# Patient Record
Sex: Female | Born: 1935 | Race: White | Hispanic: No | Marital: Married | Smoking: Never smoker
Health system: Southern US, Community
[De-identification: ages and names within clinical notes are randomized; demographics above are authoritative.]

## PROBLEM LIST (undated history)

## (undated) DIAGNOSIS — C50919 Malignant neoplasm of unspecified site of unspecified female breast: Secondary | ICD-10-CM

## (undated) DIAGNOSIS — E039 Hypothyroidism, unspecified: Secondary | ICD-10-CM

## (undated) DIAGNOSIS — H269 Unspecified cataract: Secondary | ICD-10-CM

## (undated) DIAGNOSIS — M2040 Other hammer toe(s) (acquired), unspecified foot: Secondary | ICD-10-CM

## (undated) DIAGNOSIS — N189 Chronic kidney disease, unspecified: Secondary | ICD-10-CM

## (undated) DIAGNOSIS — I10 Essential (primary) hypertension: Secondary | ICD-10-CM

## (undated) DIAGNOSIS — E7800 Pure hypercholesterolemia, unspecified: Secondary | ICD-10-CM

## (undated) HISTORY — DX: Pure hypercholesterolemia, unspecified: E78.00

## (undated) HISTORY — DX: Unspecified cataract: H26.9

## (undated) HISTORY — PX: APPENDECTOMY: SHX54

## (undated) HISTORY — PX: OTHER SURGICAL HISTORY: SHX169

## (undated) HISTORY — DX: Hypothyroidism, unspecified: E03.9

## (undated) HISTORY — DX: Chronic kidney disease, unspecified: N18.9

## (undated) HISTORY — DX: Essential (primary) hypertension: I10

## (undated) HISTORY — DX: Other hammer toe(s) (acquired), unspecified foot: M20.40

## (undated) HISTORY — PX: CATARACT EXTRACTION: SUR2

## (undated) HISTORY — DX: Malignant neoplasm of unspecified site of unspecified female breast: C50.919

## (undated) HISTORY — PX: TOTAL ABDOMINAL HYSTERECTOMY W/ BILATERAL SALPINGOOPHORECTOMY: SHX83

## (undated) HISTORY — PX: CHOLECYSTECTOMY: SHX55

---

## 2004-12-26 HISTORY — PX: MASTECTOMY: SHX3

## 2005-01-06 ENCOUNTER — Ambulatory Visit: Payer: Self-pay | Admitting: Internal Medicine

## 2005-01-11 ENCOUNTER — Ambulatory Visit: Payer: Self-pay | Admitting: Internal Medicine

## 2005-02-01 ENCOUNTER — Ambulatory Visit: Payer: Self-pay | Admitting: Surgery

## 2005-02-08 ENCOUNTER — Other Ambulatory Visit: Payer: Self-pay

## 2005-02-09 ENCOUNTER — Ambulatory Visit: Payer: Self-pay | Admitting: Surgery

## 2005-02-24 ENCOUNTER — Ambulatory Visit: Payer: Self-pay | Admitting: Radiation Oncology

## 2005-03-15 ENCOUNTER — Ambulatory Visit: Payer: Self-pay | Admitting: Surgery

## 2005-03-26 ENCOUNTER — Ambulatory Visit: Payer: Self-pay | Admitting: Radiation Oncology

## 2005-04-27 ENCOUNTER — Ambulatory Visit: Payer: Self-pay | Admitting: Internal Medicine

## 2005-05-26 ENCOUNTER — Ambulatory Visit: Payer: Self-pay | Admitting: Internal Medicine

## 2005-06-21 ENCOUNTER — Ambulatory Visit: Payer: Self-pay | Admitting: Ophthalmology

## 2005-07-23 IMAGING — NM NM SENTINAL NODE INJECTION (BREAST) - NO REPORT
2 series · 14 of 14 positions shown · non-contrast
Comparison: none

REASON FOR EXAM: Rt breast mass. Surgery [DATE]. Breast needle localization
at 10 am
COMMENTS:

[Series 1: sent node · 4.80mm/px · 6 of 56 frames shown (1 of 2)]
[frame 5/56  full-range]
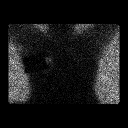
[frame 14/56  full-range]
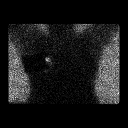
[frame 24/56  full-range]
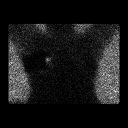
[frame 33/56  full-range]
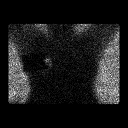
[frame 42/56  full-range]
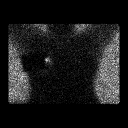
[frame 52/56  full-range]
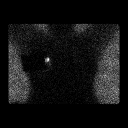

[Series 1: sent node · 2.40mm/px · 4 acquisitions, 8 frames shown (2 of 2)]
[im 1/4]
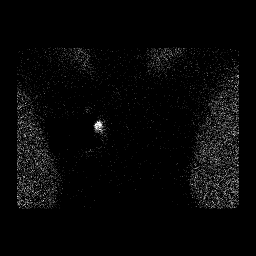
[im 1/4]
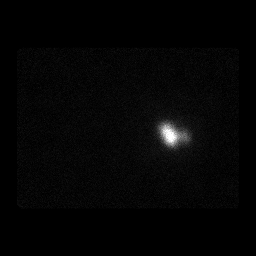
[im 2/4]
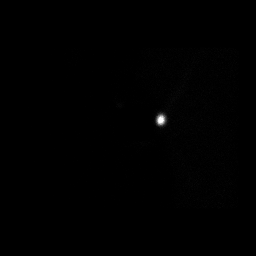
[im 2/4]
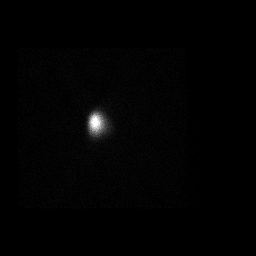
[im 3/4]
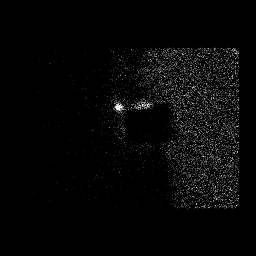
[im 3/4]
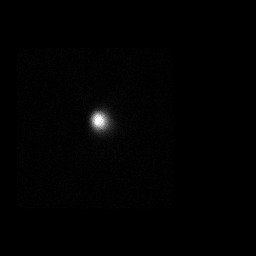
[im 4/4]
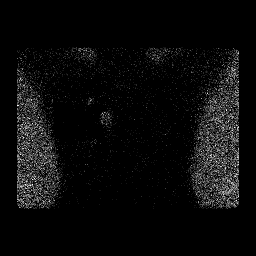
[im 4/4]
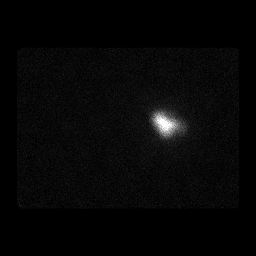

[14 of 14 positions shown; findings below may reference images not displayed]

PROCEDURE:     NM  - NM SENTINEL NODE  BREAST  - February 09, 2005 [DATE]

RESULT:     The patient was informed of the risks and benefits of the
procedure and proper informed consent was obtained.  The patient was brought
to the nuclear medicine suite and placed in a supine position.  The
periareolar region of the RIGHT breast was then prepped and draped in the
usual sterile fashion.  Utilizing approximately 4 cc of 1% Lidocaine without
epinephrine, the lateral periareolar region was anesthetized.  The lateral
periareolar region was then injected with 1.07 millicuries of
technetium-labeled sulfur colloid unfiltered.  Standard images were obtained
of the chest.  A RIGHT axillary sentinel node is identified.
IMPRESSION: RIGHT breast sentinel node evaluation as described above.  The patient
tolerated the procedure without complications.

## 2005-07-27 ENCOUNTER — Ambulatory Visit: Payer: Self-pay | Admitting: Internal Medicine

## 2005-08-26 ENCOUNTER — Ambulatory Visit: Payer: Self-pay | Admitting: Internal Medicine

## 2005-11-24 ENCOUNTER — Ambulatory Visit: Payer: Self-pay | Admitting: Internal Medicine

## 2006-01-13 ENCOUNTER — Ambulatory Visit: Payer: Self-pay | Admitting: Internal Medicine

## 2006-03-20 ENCOUNTER — Ambulatory Visit: Payer: Self-pay | Admitting: Radiation Oncology

## 2006-05-23 ENCOUNTER — Ambulatory Visit: Payer: Self-pay | Admitting: Internal Medicine

## 2006-05-26 ENCOUNTER — Ambulatory Visit: Payer: Self-pay | Admitting: Internal Medicine

## 2006-07-18 ENCOUNTER — Ambulatory Visit: Payer: Self-pay | Admitting: Internal Medicine

## 2006-08-14 ENCOUNTER — Ambulatory Visit: Payer: Self-pay | Admitting: Unknown Physician Specialty

## 2006-09-27 ENCOUNTER — Ambulatory Visit: Payer: Self-pay | Admitting: Internal Medicine

## 2006-10-26 ENCOUNTER — Ambulatory Visit: Payer: Self-pay | Admitting: Internal Medicine

## 2007-01-15 ENCOUNTER — Ambulatory Visit: Payer: Self-pay | Admitting: Internal Medicine

## 2007-02-24 ENCOUNTER — Ambulatory Visit: Payer: Self-pay | Admitting: Internal Medicine

## 2007-02-28 ENCOUNTER — Ambulatory Visit: Payer: Self-pay | Admitting: Internal Medicine

## 2007-03-27 ENCOUNTER — Ambulatory Visit: Payer: Self-pay | Admitting: Internal Medicine

## 2008-01-25 ENCOUNTER — Ambulatory Visit: Payer: Self-pay | Admitting: Internal Medicine

## 2009-01-26 ENCOUNTER — Ambulatory Visit: Payer: Self-pay | Admitting: Internal Medicine

## 2010-03-03 ENCOUNTER — Ambulatory Visit: Payer: Self-pay | Admitting: Surgery

## 2010-07-23 ENCOUNTER — Ambulatory Visit: Payer: Self-pay | Admitting: Internal Medicine

## 2011-02-09 ENCOUNTER — Ambulatory Visit: Payer: Self-pay | Admitting: Ophthalmology

## 2011-02-09 DIAGNOSIS — I119 Hypertensive heart disease without heart failure: Secondary | ICD-10-CM

## 2011-02-21 ENCOUNTER — Ambulatory Visit: Payer: Self-pay | Admitting: Ophthalmology

## 2011-03-08 ENCOUNTER — Ambulatory Visit: Payer: Self-pay | Admitting: Surgery

## 2011-10-28 ENCOUNTER — Ambulatory Visit: Payer: Self-pay | Admitting: Sports Medicine

## 2011-12-27 HISTORY — PX: KNEE ARTHROCENTESIS: SUR44

## 2012-02-09 ENCOUNTER — Emergency Department: Payer: Self-pay | Admitting: Emergency Medicine

## 2012-03-06 ENCOUNTER — Ambulatory Visit: Payer: Self-pay | Admitting: Internal Medicine

## 2012-07-23 ENCOUNTER — Ambulatory Visit: Payer: Self-pay | Admitting: General Practice

## 2012-07-23 LAB — BASIC METABOLIC PANEL
Anion Gap: 4 — ABNORMAL LOW (ref 7–16)
BUN: 11 mg/dL (ref 7–18)
Calcium, Total: 9.1 mg/dL (ref 8.5–10.1)
EGFR (African American): 58 — ABNORMAL LOW
EGFR (Non-African Amer.): 50 — ABNORMAL LOW
Glucose: 97 mg/dL (ref 65–99)
Osmolality: 277 (ref 275–301)
Potassium: 3.7 mmol/L (ref 3.5–5.1)

## 2012-07-23 LAB — URINALYSIS, COMPLETE
Bilirubin,UR: NEGATIVE
Ketone: NEGATIVE
Ph: 8 (ref 4.5–8.0)
RBC,UR: <1 /HPF (ref 0–5)
Specific Gravity: 1.009 (ref 1.003–1.030)
Squamous Epithelial: 3
WBC UR: 2 /HPF (ref 0–5)

## 2012-07-23 LAB — CBC
HCT: 38.4 % (ref 35.0–47.0)
HGB: 13.3 g/dL (ref 12.0–16.0)
MCH: 30.3 pg (ref 26.0–34.0)
RBC: 4.39 10*6/uL (ref 3.80–5.20)
RDW: 13.4 % (ref 11.5–14.5)

## 2012-07-23 LAB — PROTIME-INR
INR: 0.9
Prothrombin Time: 12.4 secs (ref 11.5–14.7)

## 2012-07-23 LAB — APTT: Activated PTT: 29.2 secs (ref 23.6–35.9)

## 2012-08-01 ENCOUNTER — Inpatient Hospital Stay: Payer: Self-pay | Admitting: General Practice

## 2012-08-02 LAB — BASIC METABOLIC PANEL
Anion Gap: 6 — ABNORMAL LOW (ref 7–16)
Calcium, Total: 8.1 mg/dL — ABNORMAL LOW (ref 8.5–10.1)
Chloride: 99 mmol/L (ref 98–107)
EGFR (African American): >60
Glucose: 119 mg/dL — ABNORMAL HIGH (ref 65–99)
Osmolality: 277 (ref 275–301)
Potassium: 4 mmol/L (ref 3.5–5.1)

## 2012-08-03 LAB — HEMOGLOBIN: HGB: 10.6 g/dL — ABNORMAL LOW (ref 12.0–16.0)

## 2012-08-03 LAB — BASIC METABOLIC PANEL
BUN: 8 mg/dL (ref 7–18)
Calcium, Total: 8.2 mg/dL — ABNORMAL LOW (ref 8.5–10.1)
Creatinine: 0.89 mg/dL (ref 0.60–1.30)
EGFR (African American): >60
EGFR (Non-African Amer.): >60
Glucose: 106 mg/dL — ABNORMAL HIGH (ref 65–99)
Osmolality: 276 (ref 275–301)
Potassium: 3.1 mmol/L — ABNORMAL LOW (ref 3.5–5.1)
Sodium: 139 mmol/L (ref 136–145)

## 2012-08-03 LAB — PLATELET COUNT: Platelet: 138 10*3/uL — ABNORMAL LOW (ref 150–440)

## 2012-08-04 LAB — BASIC METABOLIC PANEL
Anion Gap: 6 — ABNORMAL LOW (ref 7–16)
BUN: 10 mg/dL (ref 7–18)
Calcium, Total: 8.8 mg/dL (ref 8.5–10.1)
EGFR (African American): >60
EGFR (Non-African Amer.): 58 — ABNORMAL LOW
Glucose: 102 mg/dL — ABNORMAL HIGH (ref 65–99)
Osmolality: 271 (ref 275–301)
Potassium: 3.4 mmol/L — ABNORMAL LOW (ref 3.5–5.1)

## 2012-12-26 HISTORY — PX: OTHER SURGICAL HISTORY: SHX169

## 2013-03-07 ENCOUNTER — Ambulatory Visit: Payer: Self-pay | Admitting: Internal Medicine

## 2013-03-08 ENCOUNTER — Ambulatory Visit: Payer: Self-pay | Admitting: Internal Medicine

## 2013-04-05 ENCOUNTER — Ambulatory Visit: Payer: Self-pay | Admitting: Internal Medicine

## 2013-04-08 ENCOUNTER — Ambulatory Visit: Payer: Self-pay | Admitting: Internal Medicine

## 2013-04-08 LAB — COMPREHENSIVE METABOLIC PANEL
Albumin: 3.8 g/dL (ref 3.4–5.0)
Alkaline Phosphatase: 92 U/L (ref 50–136)
BUN: 15 mg/dL (ref 7–18)
Bilirubin,Total: 0.5 mg/dL (ref 0.2–1.0)
Co2: 34 mmol/L — ABNORMAL HIGH (ref 21–32)
EGFR (African American): 51 — ABNORMAL LOW
EGFR (Non-African Amer.): 44 — ABNORMAL LOW
Glucose: 105 mg/dL — ABNORMAL HIGH (ref 65–99)
Osmolality: 284 (ref 275–301)
Potassium: 4 mmol/L (ref 3.5–5.1)
SGPT (ALT): 27 U/L (ref 12–78)

## 2013-04-08 LAB — CBC CANCER CENTER
Eosinophil #: 0 x10 3/mm (ref 0.0–0.7)
Eosinophil %: 0 %
HCT: 38.6 % (ref 35.0–47.0)
HGB: 13 g/dL (ref 12.0–16.0)
MCH: 29.1 pg (ref 26.0–34.0)
MCHC: 33.8 g/dL (ref 32.0–36.0)
Monocyte #: 0.6 x10 3/mm (ref 0.2–0.9)
Monocyte %: 10.7 %
Neutrophil #: 3.4 x10 3/mm (ref 1.4–6.5)
Neutrophil %: 64.7 %
Platelet: 195 x10 3/mm (ref 150–440)
WBC: 5.3 x10 3/mm (ref 3.6–11.0)

## 2013-04-08 LAB — PROTIME-INR: INR: 0.9

## 2013-04-09 LAB — CANCER ANTIGEN 27.29: CA 27.29: 11.6 U/mL (ref 0.0–38.6)

## 2013-04-11 ENCOUNTER — Ambulatory Visit: Payer: Self-pay | Admitting: Internal Medicine

## 2013-04-25 ENCOUNTER — Ambulatory Visit: Payer: Self-pay | Admitting: Internal Medicine

## 2013-05-01 ENCOUNTER — Ambulatory Visit: Payer: Self-pay | Admitting: Surgery

## 2013-05-09 ENCOUNTER — Ambulatory Visit: Payer: Self-pay | Admitting: Surgery

## 2013-05-09 DIAGNOSIS — C50911 Malignant neoplasm of unspecified site of right female breast: Secondary | ICD-10-CM | POA: Insufficient documentation

## 2013-05-17 LAB — PATHOLOGY REPORT

## 2013-05-26 ENCOUNTER — Ambulatory Visit: Payer: Self-pay | Admitting: Internal Medicine

## 2014-03-10 ENCOUNTER — Ambulatory Visit: Payer: Self-pay | Admitting: Surgery

## 2015-03-12 ENCOUNTER — Ambulatory Visit: Payer: Self-pay | Admitting: Surgery

## 2015-04-14 NOTE — Discharge Summary (Signed)
PATIENT NAME:  Brittany Daniel, ARPIN MR#:  [id number] DATE OF BIRTH:  [birth year]  DATE OF ADMISSION:  08/01/2012 DATE OF DISCHARGE:  08/04/2012  PRIMARY DIAGNOSIS: Degenerative arthrosis of the left knee.   SECONDARY DIAGNOSES:  1. Hypertension. 2. Hyperlipidemia. 3. History of breast cancer. 4. History of vertigo.   PROCEDURE PERFORMED: Left total knee arthroplasty using computer-assisted navigation.   BRIEF HISTORY OF PRESENT ILLNESS: The patient is a 81 year old female who has been seen for progressive left knee pain. She localizes most of the pain along the medial aspect of the knee. Pain was aggravated by weight-bearing activities. She had not seen any significant improvement despite corticosteroid injection, Synvisc injection, or activity modification. She was unable to tolerate nonsteroidal antiinflammatory medications due to history of heme positive stools. She underwent MRI of the left knee which demonstrated full thickness cartilage loss involving medial femoral condyle and medial tibial plateau and as well as changes to the patellofemoral articulation. The patient stated the pain had progressed to the point that it was significantly interfering with her activities of daily living.   SUMMARY OF HOSPITAL COURSE: The patient underwent routine preoperative evaluation. Risks and benefits of total knee arthroplasty were discussed in detail with the patient. The usual perioperative course had also been discussed. The patient expressed her understanding of the risks and benefits and agreed with plans for surgical intervention.   On 08/01/2012 the patient was taken to the operating room at which time she underwent left total knee arthroplasty using computer-assisted navigation under a combination of femoral nerve block and spinal. Estimated blood loss was 50 mL. Tourniquet time was 88 minutes. Soft tissue releases included anterior cruciate ligament, posterior cruciate ligament, deep medial collateral  ligament, and patellofemoral ligament. Implants utilized included DePuy PFC Sigma size 2.5 posterior stabilized femoral component (cemented), size 2.5 MBT tibial component (cemented), 32 mm three peg oval dome patella (cemented), and a 12.5 mm stabilized rotating platform polyethylene insert. The patient tolerated the procedure well. She received perioperative antibiotics for prophylaxis. She received deep vein thrombosis prophylaxis in the form of thigh-high TED stockings, AV-1 foot pumps, and Lovenox 30 mg subcutaneous every 12 hours.   Physical therapy and occupational therapy were consulted postoperatively to begin total knee arthroplasty rehab protocol. The patient made steady progress with her rehabilitation. She was weight-bearing as tolerated to the left lower extremity. She was able to perform 10 independent straight leg raises and knee immobilizer was discontinued. Drains were discontinued on postoperative day number two and the surgical incision was inspected and felt to be healing well without erythema or significant drainage. At the time of discharge, she demonstrated greater than 90 degrees of flexion and was safe with ambulation and with stair ambulation.   The patient remained hemodynamically stable during the hospitalization. Hemoglobin on postoperative day number one was 11.0 with followup hemoglobin on postoperative day number two of 10.6. The patient did demonstrate mild hypokalemia on postoperative day number two of 3.1 and received supplemental potassium chloride with improvement of potassium to 3.4.   It was felt that the patient was medically and orthopedically stable for discharge to home.   DISPOSITION: The patient is discharged to home in stable condition.   DIET: Regular as tolerated.   MEDICATIONS:  1. Hydrochlorothiazide 25 mg p.o. daily. 2. Levothyroxine 50 mcg p.o. daily.  3. Oxycodone 5 to 10 mg p.o. every 4 hours p.r.n. severe pain. 4. Tramadol 50 to 100 mg p.o.  every 4 hours p.r.n. pain. 5. Lovenox 40  mg subcutaneous daily x14 days. 6. Enteric-coated aspirin 325 mg p.o. daily to begin after completion of Lovenox.   ACTIVITY: The patient may be weight-bearing as tolerated to the left lower extremity. She is to use a walker for ambulation under the guidance of home physical therapy.   CONSULTS: Home health physical therapy has been consulted to continue with total knee arthroplasty rehab protocol.   FOLLOWUP: The patient is to return to the office on 08/14/2012 at 2:30 p.m. to be seen by Vance Peper, PA-C. Subsequent followup is scheduled for 09/13/2012 at 9:45 to be seen by Dr. Skip Estimable.  ____________________________ Laurice Record. Holley Bouche., MD jph:slb D: 08/04/2012 09:04:54 ET T: 08/06/2012 10:41:30 ET JOB#: [id number]  cc: Laurice Record. Holley Bouche., MD, <Dictator> Vianne Bulls. Arline Asp, MD Marciano Sequin MD ELECTRONICALLY SIGNED 08/07/2012 1:07

## 2015-04-14 NOTE — Op Note (Signed)
PATIENT NAME:  Brittany Daniel, PEEDIN MR#:  [id number] DATE OF BIRTH:  03/01/1932  DATE OF PROCEDURE:  08/01/2012  PREOPERATIVE DIAGNOSIS: Degenerative arthrosis of the left knee.   POSTOPERATIVE DIAGNOSIS: Degenerative arthrosis of the left knee.   PROCEDURE PERFORMED: Left total knee arthroplasty using computer-assisted navigation.   SURGEON: Laurice Record. Holley Bouche., MD  ASSISTANT: Vance Peper, PA-C (required to maintain retraction throughout the procedure)   ANESTHESIA: Femoral nerve block and spinal.   ESTIMATED BLOOD LOSS: 50 mL.   FLUIDS REPLACED: 2000 mL of crystalloid.   TOURNIQUET TIME: 88 minutes.   DRAINS: Two medium drains to reinfusion system.   SOFT TISSUE RELEASES: Anterior cruciate ligament, posterior cruciate ligament, deep medial collateral ligament, patellofemoral ligament.   IMPLANTS UTILIZED: DePuy PFC Sigma size 2.5 posterior stabilized femoral component (cemented), size 2.5 MBT tibial component (cemented), 32 mm three peg oval dome patella (cemented), and a 12.5 mm stabilized rotating platform polyethylene insert.   INDICATIONS FOR SURGERY: The patient is a 81 year old female who has been seen for complaints of progressive left knee pain. X-rays demonstrated significant degenerative changes in tricompartmental fashion. After discussion of the risks and benefits of surgical intervention, the patient expressed her understanding of the risks, benefits and agreed with plans for surgical intervention.   PROCEDURE IN DETAIL: Patient was brought into the Operating Room and, after adequate femoral nerve block and spinal anesthesia was achieved, a tourniquet was placed on patient's upper left thigh. Patient's left knee and leg were cleaned and prepped with alcohol and DuraPrep, draped in the usual sterile fashion. A "timeout" was performed as per usual protocol. The left lower extremity was exsanguinated using Esmarch, and the tourniquet was inflated to 300 mmHg. Anterior longitudinal  incision was made followed by a standard mid vastus approach. A moderate effusion was evacuated. The deep fibers of the medial collateral ligament were elevated in subperiosteal fashion off the medial flare of the tibia so as to maintain a continuous soft tissue sleeve. Patella was subluxed laterally and the patellofemoral ligament was incised. Inspection of the knee demonstrated significant degenerative changes most notably to the medial compartment. Prominent osteophytes were debrided using rongeur. Anterior and posterior cruciate ligaments were excised. Two 4.0 mm Schanz pins were inserted into the femur and into the tibia for attachment of the array of trackers used for computer-assisted navigation. Hip center was identified using circumduction technique. Distal landmarks were mapped using computer. Distal femur and proximal tibia were mapped using the computer. Distal femoral cutting guide was positioned using computer-assisted navigation so as to achieve a 5 degree distal valgus cut. Cut was performed and verified using computer. Distal femur was sized and it was felt that a size 2.5 femoral component was appropriate. A size 2.5 cutting guide was positioned and the anterior cut was performed and verified using computer. This was followed by completion of the posterior and chamfer cuts. Femoral cutting guide for the central box was then positioned and the central box cut was performed.   Attention was then directed to the proximal tibia. Medial and lateral menisci were excised. The extramedullary tibial cutting guide was positioned using computer-assisted navigation so as to achieve a 0 degrees varus valgus alignment and 0 degrees posterior slope. Cut was performed and verified using computer. Proximal tibia was sized and it was felt that a size 2.5 tibial tray was appropriate. Tibial and femoral trials were inserted followed by insertion of first a 10 and subsequently a 12.5 mm polyethylene trial. This  allowed for  excellent mediolateral soft tissue balancing both in full extension and in flexion. Finally, the patella was cut and prepared so as to accommodate a 32 mm three peg oval dome patella. Patellar trial was placed and the knee was placed through a range of motion. Excellent patellar tracking was appreciated. Femoral trial was removed. Central post hole for the tibial component was reamed followed by insertion of a keel punch. Tibial trials were then removed. Cut surfaces of bone were irrigated with copious amounts of normal saline with antibiotic solution using pulsatile lavage and then suctioned dry. Polymethyl methacrylate cement was prepared in the usual fashion using a vacuum mixer. Cement was applied to the cut surface of the proximal tibia as well as along the undersurface of a size 2.5 MBT tibial component. Tibial component was positioned and impacted into place. Excess cement was removed using freer elevators. Cement was then applied to the cut surface of the femur as well as along the posterior flanges of a size 2.5 posterior stabilized femoral component. Femoral component was positioned and impacted into place. Excess cement was removed using freer elevators. A 12.5 mm stabilized rotating platform polyethylene trial was inserted and the knee was brought into full extension with steady axial compression applied. Finally, cement was applied to the backside of a 32 mm three peg oval dome patella and the patellar component was positioned and patellar clamp applied. Excess cement was removed using freer elevators.   After adequate curing of cement, tourniquet was deflated after total tourniquet time of 88 minutes. Hemostasis was achieved using electrocautery. The knee was irrigated with copious amounts of normal saline with antibiotic solution using pulsatile lavage and then suctioned dry. The knee was inspected for any residual cement debris. 30 mL of 0.25% Marcaine with epinephrine was injected  along the posterior capsule. A 12.5 mm stabilized rotating platform polyethylene insert was inserted and the knee was placed through a range of motion. Excellent patellar tracking was appreciated and excellent mediolateral soft tissue balancing was appreciated.   Two medium drains were placed in the wound bed and brought out through a separate stab incision to be attached to reinfusion system. The medial parapatellar portion of the incision was reapproximated using interrupted sutures of #1 Vicryl. Subcutaneous tissue was approximated in layers using first #0 Vicryl followed by 2-0 Vicryl. Skin was closed with skin staples. Sterile dressing was applied.   Patient tolerated procedure well. She was transported to the recovery room in stable condition.    ____________________________ Laurice Record. Holley Bouche., MD jph:cms D: 08/01/2012 20:14:04 ET T: 08/02/2012 08:32:48 ET JOB#: [id number]  cc: Laurice Record. Holley Bouche., MD, <Dictator> JAMES P Holley Bouche MD ELECTRONICALLY SIGNED 08/05/2012 9:18

## 2015-04-17 NOTE — Op Note (Signed)
PATIENT NAME:  Brittany Daniel, Brittany Daniel MR#:  [id number] DATE OF BIRTH:  Jun 25, 1932  DATE OF PROCEDURE:  05/09/2013  PREOPERATIVE DIAGNOSIS: Carcinoma of the right breast.   POSTOPERATIVE DIAGNOSIS: Carcinoma of the right breast.   PROCEDURE: Right modified radical mastectomy.   SURGEON: Rochel Brome, M.D.   ANESTHESIA: General.   INDICATIONS: This 81 year old female has a prior history of cancer of the right breast with partial mastectomy, sentinel lymph node biopsy, and radiation brachytherapy. She recently had findings of a mass in the scar of the upper aspect, right breast, and in the axilla, and biopsies demonstrated ductal carcinoma at both sites, and surgery was recommended for definitive  treatment.   DESCRIPTION OF PROCEDURE: The patient was placed on the operating table in the supine position under general anesthesia. The right arm was placed on a lateral arm rest. The breast and surrounding chest wall and upper arm were prepared with ChloraPrep and draped in a sterile manner.   An incision was made from inferomedial up to the axilla, both above and below the breast. Skin and subcutaneous flaps were retracted with 3-0 silk, and flaps were raised up in the direction of the clavicle medially to the sternum, inferiorly to the inferior mammary groove, and laterally to the latissimus dorsi muscle. The upper scar was just about 1.5 cm cephalad to the palpable tumor, and also the axillary portion of the incision did surround the palpable axillary mass. The breast was dissected away from the underlying deep fascia and pectoralis major muscle using electrocautery for hemostasis, and dissection was carried out further up into the axilla. There were some palpable firm lymph nodes within the axilla that appeared to be suspicious for metastasis, and dissection was carried out in the axilla, dissecting the axillary contents at the site where the palpable lymph nodes were found, with dissection was carried up to  the axillary vein and also the determined site of the thoracodorsal vessels, which were left intact.   The intercostal nerve was also saved. The specimen was submitted for routine pathology. The remainder of the axilla was palpated, and there was no remaining palpable mass. It is noted that during the course of the procedure, numerous clamped vessels were suture-ligated with  4-0 chromic. The whole wound was inspected. Several small bleeding points were cauterized. Hemostasis was subsequently intact. Two Blake drains were inserted through inferior stab wounds. One drain was placed into the axilla and the other along the anterior chest wall. Both drains were sutured to the skin with 3-0 nylon.   Next, the wound was closed with a running 4-0 Monocryl subcuticular suture. There was also some redundancy of the skin laterally on the inferior side, and a 3-inch wedge of skin was resected so that the suture line was perpendicular to the longer suture line, and this was also closed with running 4-0 Monocryl.   Next, the suture line was treated with Dermabond using 2 containers, allowed to dry. The Dermabond was also placed over the drain site. The drain site was then covered with benzoin and Tegaderm, and the drains were further anchored with benzoin and 2-inch silk tape. The drains were activated. There was scant serosanguineous drainage. The patient appeared to tolerate the procedure satisfactorily and was prepared for transfer to the recovery room.    ____________________________ Lenna Sciara. Rochel Brome, MD jws:dm D: 05/09/2013 12:20:27 ET T: 05/09/2013 12:55:33 ET JOB#: [id number]  cc: Loreli Dollar, MD, <Dictator> Loreli Dollar MD ELECTRONICALLY SIGNED 05/14/2013 10:20

## 2015-04-19 NOTE — Op Note (Signed)
PATIENT NAME:  Brittany Daniel, Brittany Daniel MR#:  [id number] DATE OF BIRTH:  11-25-1932  DATE OF PROCEDURE:  02/09/2012  PREOPERATIVE DIAGNOSIS: Laceration of forehead and scalp 11 cm (4.5 inches) in length.   POSTOPERATIVE DIAGNOSIS: Laceration of forehead and scalp 11 cm (4.5 inches) in length.   OPERATION PERFORMED: Suture of laceration 4.5 inches forehead and scalp.   SURGEON: Odella Aquas. Andree Elk, M.D.  ANESTHESIA: 1% lidocaine with epinephrine local.   COMPLICATIONS: None.  DRAINS AND PACKS: None.  COUNTS: Sponge, needle and instrument counts were not applicable. TISSUE REMOVED: None.   DESCRIPTION OF PROCEDURE:  The patient's forehead and scalp were cleaned and then prepared with Betadine. The laceration was not bleeding actively at this time. The skin edges were infiltrated with 1% lidocaine plus adrenaline. The forehead portion was sutured with interrupted 5-0 Ethilon sutures. The scalp was closed with interrupted 3-0 Ethilon sutures. The procedure went easily and appeared to be good without any active bleeding. The patient was given some instructions and that it is okay to bathe or wash her hair. She will be then discharged by the Emergency Department.    ____________________________ Odella Aquas. Andree Elk, MD gla:ap D: 02/09/2012 23:33:27 ET T: 02/10/2012 10:35:02 ET JOB#: [id number]  cc: Berneta Sages L. Andree Elk, MD, <Dictator> Joaquin Courts MD ELECTRONICALLY SIGNED 02/21/2012 10:34

## 2018-05-25 ENCOUNTER — Other Ambulatory Visit: Payer: Self-pay | Admitting: Sports Medicine

## 2018-05-25 DIAGNOSIS — S22000A Wedge compression fracture of unspecified thoracic vertebra, initial encounter for closed fracture: Secondary | ICD-10-CM

## 2018-05-25 DIAGNOSIS — M546 Pain in thoracic spine: Secondary | ICD-10-CM

## 2018-05-28 ENCOUNTER — Ambulatory Visit
Admission: RE | Admit: 2018-05-28 | Discharge: 2018-05-28 | Disposition: A | Discharge status: Home / Self Care | Payer: Medicare Other | Source: Ambulatory Visit | Attending: Sports Medicine | Admitting: Sports Medicine

## 2018-05-28 DIAGNOSIS — G8929 Other chronic pain: Secondary | ICD-10-CM | POA: Insufficient documentation

## 2018-05-28 DIAGNOSIS — I517 Cardiomegaly: Secondary | ICD-10-CM | POA: Insufficient documentation

## 2018-05-28 DIAGNOSIS — X58XXXA Exposure to other specified factors, initial encounter: Secondary | ICD-10-CM | POA: Diagnosis not present

## 2018-05-28 DIAGNOSIS — M545 Low back pain: Secondary | ICD-10-CM | POA: Insufficient documentation

## 2018-05-28 DIAGNOSIS — S22000A Wedge compression fracture of unspecified thoracic vertebra, initial encounter for closed fracture: Secondary | ICD-10-CM | POA: Diagnosis not present

## 2018-05-28 DIAGNOSIS — M546 Pain in thoracic spine: Secondary | ICD-10-CM | POA: Diagnosis present

## 2018-05-28 DIAGNOSIS — J90 Pleural effusion, not elsewhere classified: Secondary | ICD-10-CM | POA: Diagnosis not present

## 2018-05-28 DIAGNOSIS — R937 Abnormal findings on diagnostic imaging of other parts of musculoskeletal system: Secondary | ICD-10-CM | POA: Insufficient documentation

## 2018-06-04 ENCOUNTER — Telehealth: Payer: Self-pay | Admitting: *Deleted

## 2018-06-04 NOTE — Telephone Encounter (Signed)
  Is her appt on Tuesday or Wednesday?  M

## 2018-06-04 NOTE — Telephone Encounter (Signed)
Tuesday, I looked at it wrong the first time

## 2018-06-04 NOTE — Telephone Encounter (Signed)
Per Dr Mike Gip, cannot care for someone she has never seen. Glad to see her if she can come in, otherwise, she should contact the PCP. I returned call to daughter who states she plans on bringing her to the appointment which is tomorrow afternoon. I mentioned about treatment vs hospice, vs Home Health and that Dr Mike Gip will discuss this tomorrow

## 2018-06-04 NOTE — Telephone Encounter (Signed)
Patient is a new referral being seen 06/06/18 for abnormal weight loss. Daughter called report ing that patient condition has declined rapidly requiring her and her brother both to care for her. She is asking what options they have to help them. She states they want to care for her at home. She is not eating and is sleeping all the time. Please advise   CLINICAL DATA:  84-year-old female with 3 months of increasing mid back pain. Personal history of breast cancer in 2014.  EXAM: MRI THORACIC SPINE WITHOUT CONTRAST  TECHNIQUE: Multiplanar, multisequence MR imaging of the thoracic spine was performed. No intravenous contrast was administered.  COMPARISON:  PET-CT 04/11/2013. Cervical spine CT 02/09/2012. Chest CT 07/23/2010.  FINDINGS: Limited cervical spine imaging: Preserved cervical vertebral height and alignment, but scattered heterogeneous loss of the normal bone marrow signal suggesting malignant marrow infiltration - such as at the C2 spinous process, C7 vertebral body and spinous process (series 2, image 5).  Thoracic spine segmentation:  Appears to be normal.  Alignment:  Exaggerated upper thoracic kyphosis.  Vertebrae: Widespread abnormal marrow signal throughout the thoracic spine, and visible upper lumbar spine. Likewise there is heterogeneous abnormal marrow signal in the visible posterior ribs. Intermittent expansile rib lesions (such as the posterior left 11th rib on series 8, image 37.  Widespread vertebral body and posterior element involvement (series 8, image 18), and the infiltrative appearing marrow signal is confluent from the T3 through the T5 vertebrae.  Mild T2 superior endplate compression fracture which could be pathologic, but probably was present on the 2013 CT.  There is a mild T10 superior endplate deformity which does demonstrate mildly increased marrow edema compared to other levels on series 6, image 7.  Despite the extensive  spinal and skeletal involvement, there is only minimal epidural extension of disease suspected in the thoracic spine (such as at T4 on series 7, image 13). At the T12 level on the right there is early encroachment into the right T11 neural foramen due to extra osseous extension from the right T12 superior articulating facet (series 5, image 4).  Cord: Capacious thoracic spinal canal. No thoracic spinal cord edema or expansion. There is either mild physiologic prominence of the central spinal canal at the T7-T8 level (series 7, image 23), or a small spinal cord syrinx. The conus medullaris appears normal at T12-L1.  Paraspinal and other soft tissues: Small bilateral pleural effusions. Partially visible cardiomegaly. Grossly negative visible liver. Benign appearing bilateral renal cysts. No lymphadenopathy in the visible chest or upper abdomen. Negative visualized posterior paraspinal soft tissues.  Disc levels:  Thoracic disc degeneration, but no thoracic spinal stenosis.  No thoracic neural foraminal stenosis aside from the early right foraminal involvement at T11-T12 described above.  IMPRESSION: 1. Diffuse infiltration of the visible spine and skeleton in keeping with widespread metastatic disease, multiple myeloma, or less likely lymphoma. Occasional mild pathologic compression fractures, including the T10 level. 2. Minimal extra-osseous and epidural extension of disease in the thoracic spine despite the extensive spinal involvement. No thoracic spinal stenosis. Early involvement of the right T11 neural foramen. 3. Small bilateral pleural effusions. Cardiomegaly. The visible liver appears to remain normal.  Electronically Signed: By: H  Hall M.D. On: 05/28/2018 12:48 

## 2018-06-05 ENCOUNTER — Other Ambulatory Visit: Payer: Self-pay

## 2018-06-05 ENCOUNTER — Inpatient Hospital Stay: Payer: Medicare Other | Attending: Hematology and Oncology | Admitting: Hematology and Oncology

## 2018-06-05 ENCOUNTER — Encounter: Payer: Self-pay | Admitting: *Deleted

## 2018-06-05 DIAGNOSIS — C7951 Secondary malignant neoplasm of bone: Secondary | ICD-10-CM | POA: Diagnosis not present

## 2018-06-05 DIAGNOSIS — Z79899 Other long term (current) drug therapy: Secondary | ICD-10-CM | POA: Diagnosis not present

## 2018-06-05 DIAGNOSIS — Z853 Personal history of malignant neoplasm of breast: Secondary | ICD-10-CM | POA: Insufficient documentation

## 2018-06-05 DIAGNOSIS — Z9223 Personal history of estrogen therapy: Secondary | ICD-10-CM | POA: Diagnosis not present

## 2018-06-05 DIAGNOSIS — M549 Dorsalgia, unspecified: Secondary | ICD-10-CM | POA: Diagnosis not present

## 2018-06-05 DIAGNOSIS — Z9011 Acquired absence of right breast and nipple: Secondary | ICD-10-CM | POA: Diagnosis not present

## 2018-06-05 DIAGNOSIS — J90 Pleural effusion, not elsewhere classified: Secondary | ICD-10-CM | POA: Diagnosis not present

## 2018-06-05 DIAGNOSIS — R5383 Other fatigue: Secondary | ICD-10-CM | POA: Diagnosis not present

## 2018-06-05 DIAGNOSIS — R4789 Other speech disturbances: Secondary | ICD-10-CM | POA: Insufficient documentation

## 2018-06-05 DIAGNOSIS — R410 Disorientation, unspecified: Secondary | ICD-10-CM

## 2018-06-05 DIAGNOSIS — R400 Somnolence: Secondary | ICD-10-CM | POA: Diagnosis not present

## 2018-06-05 DIAGNOSIS — R441 Visual hallucinations: Secondary | ICD-10-CM | POA: Insufficient documentation

## 2018-06-05 DIAGNOSIS — Z66 Do not resuscitate: Secondary | ICD-10-CM

## 2018-06-05 DIAGNOSIS — M40204 Unspecified kyphosis, thoracic region: Secondary | ICD-10-CM

## 2018-06-05 DIAGNOSIS — R937 Abnormal findings on diagnostic imaging of other parts of musculoskeletal system: Secondary | ICD-10-CM | POA: Insufficient documentation

## 2018-06-05 DIAGNOSIS — E7800 Pure hypercholesterolemia, unspecified: Secondary | ICD-10-CM

## 2018-06-05 DIAGNOSIS — E039 Hypothyroidism, unspecified: Secondary | ICD-10-CM | POA: Diagnosis not present

## 2018-06-05 DIAGNOSIS — Z803 Family history of malignant neoplasm of breast: Secondary | ICD-10-CM | POA: Insufficient documentation

## 2018-06-05 DIAGNOSIS — C50911 Malignant neoplasm of unspecified site of right female breast: Secondary | ICD-10-CM

## 2018-06-05 DIAGNOSIS — R112 Nausea with vomiting, unspecified: Secondary | ICD-10-CM

## 2018-06-05 DIAGNOSIS — Z170 Estrogen receptor positive status [ER+]: Secondary | ICD-10-CM

## 2018-06-05 DIAGNOSIS — H538 Other visual disturbances: Secondary | ICD-10-CM

## 2018-06-05 DIAGNOSIS — R5381 Other malaise: Secondary | ICD-10-CM | POA: Insufficient documentation

## 2018-06-05 DIAGNOSIS — N189 Chronic kidney disease, unspecified: Secondary | ICD-10-CM | POA: Diagnosis not present

## 2018-06-05 DIAGNOSIS — R634 Abnormal weight loss: Secondary | ICD-10-CM | POA: Diagnosis not present

## 2018-06-05 DIAGNOSIS — Z800 Family history of malignant neoplasm of digestive organs: Secondary | ICD-10-CM | POA: Insufficient documentation

## 2018-06-05 DIAGNOSIS — I129 Hypertensive chronic kidney disease with stage 1 through stage 4 chronic kidney disease, or unspecified chronic kidney disease: Secondary | ICD-10-CM | POA: Diagnosis not present

## 2018-06-05 DIAGNOSIS — Z923 Personal history of irradiation: Secondary | ICD-10-CM | POA: Diagnosis not present

## 2018-06-05 NOTE — Progress Notes (Signed)
Patient presents to clinic today. Pt lethargic; rapid respirations noted. Patient will arouse to touch and name. She was able to tell me her birth month, first name and place-(hospital). Patient falls back to sleep. Per pt's daughter/son. This is a new change since last Saturday. Patient is confused. Daughter states that she is "seeing people in her room at home. She asked me to tell them to go away." son is having challenges at home toileting the patient. Pt has fallen several times over the last week. Patient is bedfast/wheelchair bound. Patient unable to swallow pills- easily chokes. For this reason, unable to tolerate tramadol which was prescribed by pcp. Per daughter - "family is adminstering calcium vit. d; hctz and synthroid in but that's it. that's a challenge to get her to swallow these pills."   Daughter would like to discuss goals of care - per family - "we don't want aggressive treatment if this is not curable."

## 2018-06-05 NOTE — Progress Notes (Signed)
Mouth care performed by RN with glycerin mouth swabs.

## 2018-06-05 NOTE — Progress Notes (Signed)
[facility] day:  06/05/2018  Chief Complaint: Brittany Daniel is a 84 y.o. female with a history of recurrent right breast cancer and recent abnormal weight loss and MRI findings worrisome for metastatic disease who is referred in consultation by Dr. Derinda Late for assessment and management.  HPI:  The patient has a history of right breast cancer in 2006.  She underwent lumpectomy and sentinel lymph node biopsy on 02/14/2005 by Dr. Rochel Brome.  Pathology revealed a 1.7 x 0.8 x 0.7 cm grade 1 invasive ductal carcinoma.  Tumor was ER/PR positive and HER-2/neu negative.  She received MammoSite radiation.  She received Arimidex from 04/2005 - 08/2006.  She declined other hormonal options.  She presented with an abnormal mass in the scar of the right upper outer breast and in the axilla in 2014.  Fine-needle aspirate of the right breast mass and right axillary lymph node on 04/03/2013 confirmed ductal carcinoma.  PET scan on 04/11/2013 revealed focal hypermetabolic activity at the site of surgical scar in the right breast as well is in a nodule in the periphery of the superolateral right breast near the axilla. The findings were concerning for more malignancy, with a metastatic lymph node near the axilla. There was no evidence of FDG avid metastatic disease in the neck, chest, abdomen, or pelvis.  CA27.29 was 11.6 on 04/08/2018.  She underwent right modfiied mastectomy and axillary node dissection by Dr. Rochel Brome on 05/09/2013.  Pathology revealed multi-focal grade III invasive mammary carcinoma.  There were 2 separate foci of invasive mammary carcinoma (3 cm and 1.5 cm) approximately 5.7 cm apart.  There was metastatic carcinoma in 1 of 5 lymph nodes (1.8 cm with extracapsular extension).  Tumor extended into the overlying dermis.  Tumor was ER + (> 90%), PR + (70%), and Her2neu-. Pathologic stage was T2 m N1.  She was on Aromasin for approximately 2  weeks  She discontinued therapy secondary to joint pain and difficulty walking.  Notes from 06/11/2013 document that she had "decided not to take anything".   Left mammogram on 03/12/2015 revealed no evidence of malignancy.  She was seen by Dr. Baldemar Lenis on 06/01/2018 and she was feeling poorly for 6 weeks.  She had significant back pain.  She had lost 17 pounds in 3 months.  Thoracic spine films on 05/25/2018 revealed increased thoracic kyphosis.  There was wedge deformity compression deformity in the lower thoracic region as well as compression deformity in the upper thoracic spine.  Degenerative changes diffusely.  Thoracic spine MRI on 05/28/2018 revealed diffuse infiltration of the visible spine and skeleton c/w widespread metastatic disease, multiple myeloma, or less likely lymphoma. There were occasional mild pathologic compression fractures, including the T10 level.  There was minimal extra-osseous and epidural extension of disease in the thoracic spine despite the extensive spinal involvement. There was no thoracic spinal stenosis. There was early involvement of the right T11 neural foramen.  There was small bilateral pleural effusions and ardiomegaly.   Family history is notable for a sister with breast cancer (age 25), mother with colon cancer (age 14), and father with gastric cancer (age 71).  She was seen by her PCP on Friday, 06/01/2018.  She was able to complete ADLs independently. Today, she presents with a profound decline in performance status. Patient is somnolent in clinic. She has difficulties holding her head up. She is holding oral secretions in her mouth.  She is observed drooling in clinic.  Patient unable to care for herself at this point. H er adult children are having to care for her. She has a mumbling speech pattern. Patient unable to ambulate. She is eating only bites at a time. She is progressively losing weight per daughter's report. She strangles easily with fluids.    There have been no appreciated fevers or sweats. Daughter notes that patient is confused. Patient able to say her name clearly in clinic. She is having visual hallucinations. Patient is "seeing people who are not there". Daughter states, "she asks me to make them go away".  She was able to express this morning that she was having trouble seeing her daughter.   Patient with nausea and vomiting associated with use of pain medications. She vomited x 3 following morphine dose. She was switched to oral Tramadol, which also made her vomit. Family notes that patient has not had a recent bowel movement. Last normal bowel movement was over a week ago.  Patient yells out in pain when moved by her family for routine care activities.  Adult children share HCPOA. She has a living will, however they are unsure what it says. Formal documentation is "packed up somewhere". Patient unable to verbalize her wishes in clinic today.    Past Medical History:  Diagnosis Date  . Cataracts, bilateral   . CKD (chronic kidney disease)   . Hammer toe   . Hypercholesterolemia   . Hypertension   . Hypothyroidism   . Malignant neoplasm of breast (female) [facility])     Past Surgical History:  Procedure Laterality Date  . APPENDECTOMY    . CATARACT EXTRACTION Right   . CHOLECYSTECTOMY    . EXCISION NASAL POLYPS    . KNEE ARTHROCENTESIS Left 2013  . MASTECTOMY  2006   stage 2 cancer with sentinel lymph node biopsy    . mastectomy  2014   Right modified radical mastectomy  . TOTAL ABDOMINAL HYSTERECTOMY W/ BILATERAL SALPINGOOPHORECTOMY      Family History  Problem Relation Age of Onset  . Cirrhosis Brother   . Heart failure Brother   . Hypertension Father   . Pancreatic cancer Father   . Colon cancer Mother   . CAD Mother   . Heart attack Mother   . Breast cancer Sister     Social History:  reports that she has never smoked. She has never used smokeless tobacco. She reports that she drank alcohol. She  reports that she has current or past drug history.  She lives in [place].  The patient is accompanied by daughter Derl Barrow) and son Zenia Resides) today.  Allergies:  Allergies  Allergen Reactions  . Morphine Nausea Only    Current Medications: Current Outpatient Medications  Medication Sig Dispense Refill  . calcium-vitamin D (OSCAL WITH D) 500-200 MG-UNIT TABS tablet Take 1 tablet by mouth daily.    . hydrochlorothiazide (HYDRODIURIL) 25 MG tablet Take 1 tablet by mouth daily.    Marland Kitchen levothyroxine (SYNTHROID, LEVOTHROID) 50 MCG tablet Take 1 tablet by mouth daily.     No current facility-administered medications for this visit.     Review of Systems  Constitutional: Positive for malaise/fatigue and weight loss (progressive; has lost 17-20 pounds in the last 6 months). Negative for diaphoresis and fever.       Requires full care.  Somnolent in clinic. Profound acute change in functional status.  HENT: Negative for congestion and nosebleeds.        Drooling. Difficulty handling oropharyngeal secretions.   Eyes:  Negative.        Daughter notes that the patient had difficulty seeing her today.  Respiratory: Negative for cough, hemoptysis, sputum production and shortness of breath.   Cardiovascular: Negative for chest pain, palpitations, orthopnea, leg swelling and PND.  Gastrointestinal: Positive for constipation (LNBM was over a week ago), nausea and vomiting. Negative for abdominal pain, blood in stool, diarrhea and melena.  Genitourinary: Negative for dysuria, frequency, hematuria and urgency.  Musculoskeletal: Positive for back pain. Negative for falls, joint pain and myalgias.  Skin: Negative for itching and rash.  Neurological: Positive for speech change (mumbling speech pattern) and weakness (generalized; unable to hold head in upright position.). Negative for dizziness, tremors and headaches.  Endo/Heme/Allergies: Does not bruise/bleed easily.  Psychiatric/Behavioral: Positive for  hallucinations. Negative for depression, memory loss and suicidal ideas. The patient is not nervous/anxious and does not have insomnia.        Acute confusion  All other systems reviewed and are negative.  Physical Exam: Blood pressure (!) 143/80, pulse (!) 102, temperature (!) 95.6 F (35.3 C), temperature source Tympanic, resp. rate (!) 26, height 5' 3" (1.6 m), weight 155 lb (70.3 kg), SpO2 92 %. GENERAL:  Elderly woman sitting in a wheelchair in the exam room in no acute distress.  She is poorly responsive. MENTAL STATUS:  Unable to assess.  Speaks few words. HEAD:  Head down.  Short white hair.  Normocephalic, atraumatic, face symmetric, no Cushingoid features. EYES:  Pupils equal round and reactive to light and accomodation.  No conjunctivitis or scleral icterus. ENT:  Drooling at times.  Oropharynx clear without lesion.  Tongue normal. Mucous membranes moist.  RESPIRATORY:  Clear to auscultation without rales, wheezes or rhonchi. CARDIOVASCULAR:  Regular rate and rhythm without murmur, rub or gallop. ABDOMEN:  Soft, non-tender, with active bowel sounds, and no hepatosplenomegaly.  No masses. BACK:  Pain on palpation. SKIN:  No rashes, ulcers or lesions. EXTREMITIES: No edema, no skin discoloration or tenderness.  No palpable cords. LYMPH NODES: No palpable cervical, supraclavicular, axillary or inguinal adenopathy  NEUROLOGICAL: Somnolent.  No seizure activity.  Moves all 4 extremities.   No visits with results within 3 Day(s) from this visit.  Latest known visit with results is:  No results found for any previous visit.    Assessment:  LEILANNI HALVORSON is a 84 y.o. female with widely metastatic cancer based on MRI imaging.  Possible primary sites include breast, myeloma or lymphoma.  She has had a rapid decline in health over the past week.  She has a history of right breast cancer s/p lumpectomy and sentinel lymph node biopsy on 02/14/2005.  Pathology revealed a 1.7 x 0.8 x 0.7  cm grade 1 invasive ductal carcinoma.  Tumor was ER/PR positive and HER-2/neu negative.  She received MammoSite radiation.  She received Arimidex from 04/2005 - 08/2006.  She declined further hormonal options.  PET scan on 04/11/2013 revealed focal hypermetabolic activity at the site of surgical scar in the right breast as well is in a nodule in the periphery of the superolateral right breast near the axilla. CA27.29 was 11.6 on 04/08/2018.  She underwent right modfiied mastectomy and axillary node dissection on 05/09/2013.  Pathology revealed multi-focal grade III invasive mammary carcinoma.  There were 2 separate foci of invasive mammary carcinoma (3 cm and 1.5 cm) approximately 5.7 cm apart.  There was metastatic carcinoma in 1 of 5 lymph nodes (1.8 cm with extracapsular extension).  Tumor extended into the overlying  dermis.  Tumor was ER + (> 90%), PR + (70%), and Her2neu-. Pathologic stage was T2 m N1.  She was on Aromasin for 2 weeks.  She declined further therapy.   Thoracic spine MRI on 05/28/2018 revealed diffuse infiltration of the visible spine and skeleton c/w widespread metastatic disease, multiple myeloma, or less likely lymphoma. There were occasional mild pathologic compression fractures, including the T10 level.  There was minimal extra-osseous and epidural extension of disease in the thoracic spine despite the extensive spinal involvement. There was no thoracic spinal stenosis. There was early involvement of the right T11 neural foramen.  There was small bilateral pleural effusions and ardiomegaly.   Her family is not interested in heroic measures.  Code status is DNR/DNI.  Symptomatically, she has had a rapid decline in her health.  Performance status is 4.  Plan: 1. Discuss stage IV metastatic disease based on imaging.  Performance status has rapidly declined over the course of the last few days. Treatment at this point would be palliative only, as patient would not be cured with  aggressive treatment.  Family not interested in pursuing diagnosis or treatment as she is not curable.  Patient unable to verbalize wishes in clinic regarding admission for supportive treatment vs. discharge home with hospice for comfort care only.  Life expectancy without intervention is short (less than 2 weeks).  Family would like to see if patient can be transferred from clinic to the hospice home. Hospital hospice liaison contacted.  2. Discuss advanced directives.  Adult children share HCPOA. Patient has a living will, but they are unsure what it says. Code status reviewed. When reviewed with patient, she was able to indicated that her wishes are to be DNR/DNI.  Both children agree to DNR/DNI.      Honor Loh, NP 06/05/2018, 2:46 PM   I saw and evaluated the patient, participating in the key portions of the service and reviewing pertinent diagnostic studies and records.  I reviewed the nurse practitioner's note and agree with the findings and the plan.  The assessment and plan were discussed with the patient and her family.  Multiple questions were asked by the family and answered.   Nolon Stalls, MD 06/05/2018, 2:46 PM

## 2018-06-25 DEATH — deceased

## 2018-11-08 IMAGING — MR MR THORACIC SPINE W/O CM
6 series · 48 of 48 positions shown · non-contrast
Comparison: PET-CT 04/11/2013. Cervical spine CT 02/09/2012. Chest
CT 07/23/2010.

ADDENDUM:
Study discussed by telephone with Nurse Faxx Tiger in the office
of Dr. JOAQUIM CASTRO PAGAIMO on 05/28/2018 at 1421 hours.
CLINICAL DATA: 86-year-old female with 3 months of increasing mid
back pain. Personal history of breast cancer in 9497.

EXAM:
MRI THORACIC SPINE WITHOUT CONTRAST
TECHNIQUE: Multiplanar, multisequence MR imaging of the thoracic spine was
performed. No intravenous contrast was administered.

[Series 2: sag loc for · sagittal · 5.0mm · 0.68mm/px · 4 of 9 slices shown]
[im 1/9]
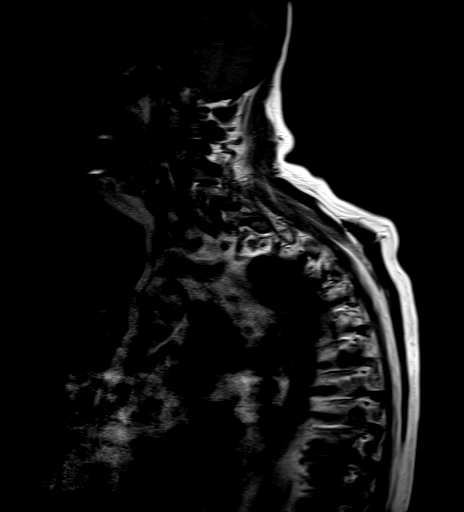
[im 3/9]
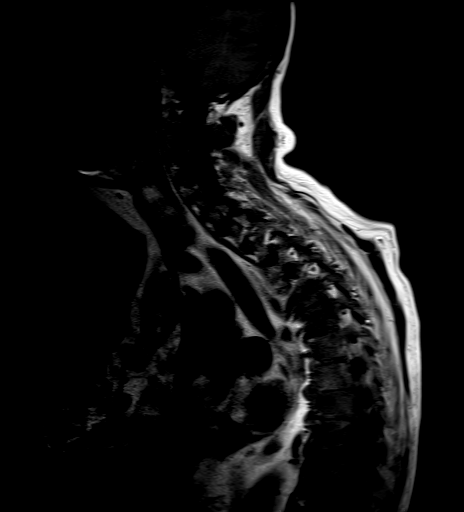
[im 6/9]
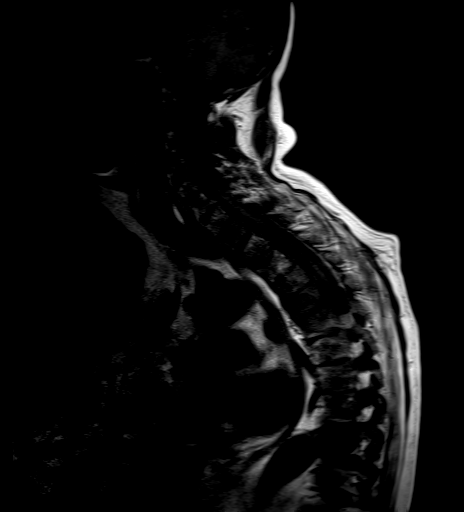
[im 9/9]
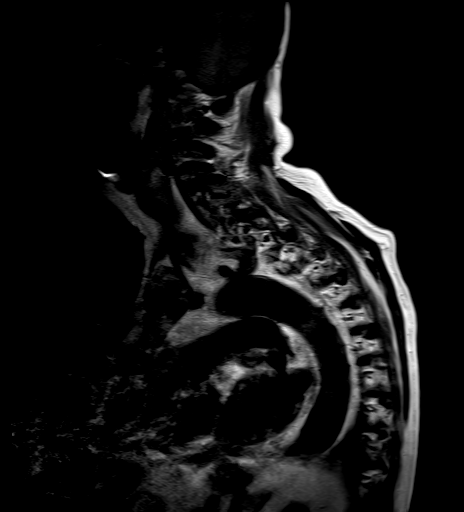

[Series 4: T2 · sagittal · 4.0mm · 1.33mm/px · 6 of 15 slices shown (1 of 2)]
[im 1/15]
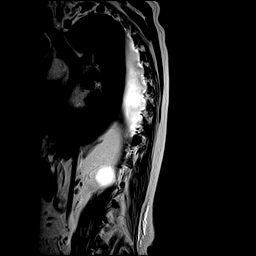
[im 3/15]
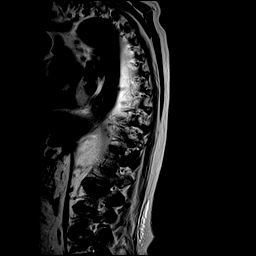
[im 6/15]
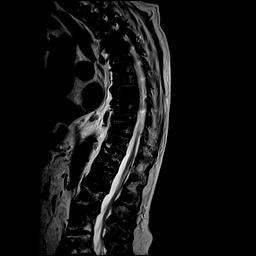
[im 9/15]
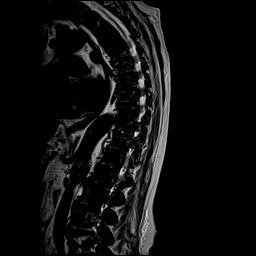
[im 12/15]
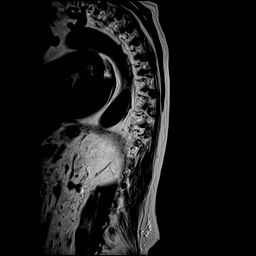
[im 15/15]
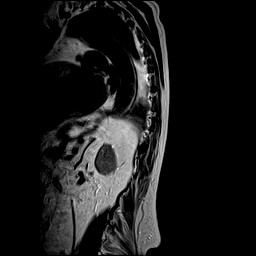

[Series 5: T1 · sagittal · 4.0mm · 1.33mm/px · 5 of 15 slices shown]
[im 1/15]
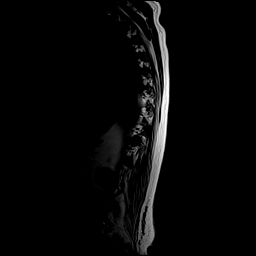
[im 4/15]
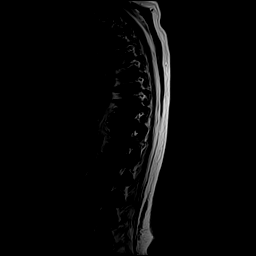
[im 8/15]
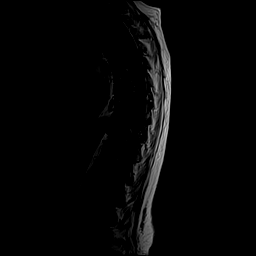
[im 11/15]
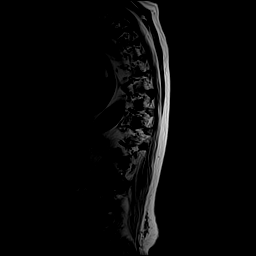
[im 15/15]
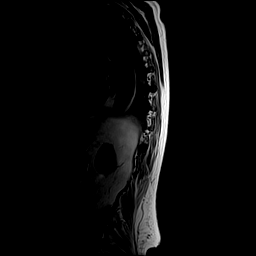

[Series 6: STIR · sagittal · 4.0mm · 1.33mm/px · 5 of 15 slices shown]
[im 1/15]
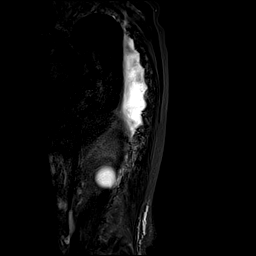
[im 4/15]
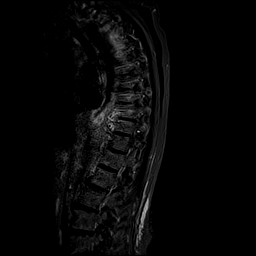
[im 8/15]
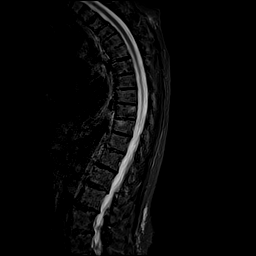
[im 11/15]
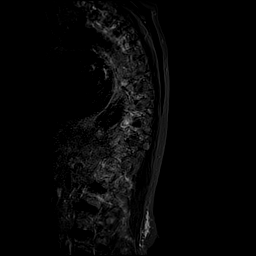
[im 15/15]
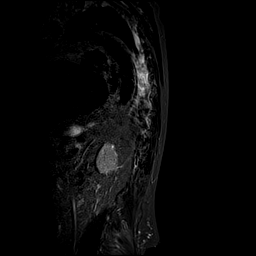

[Series 7: T2 · axial · 5.0mm · 0.86mm/px · z∈[-212,-70]mm · 14 of 40 slices shown (2 of 2)]
[im 1/40]
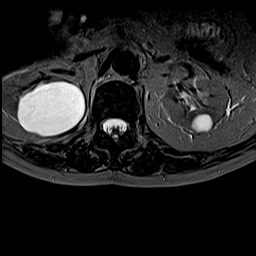
[im 4/40]
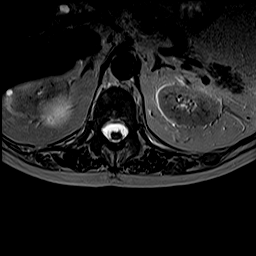
[im 7/40]
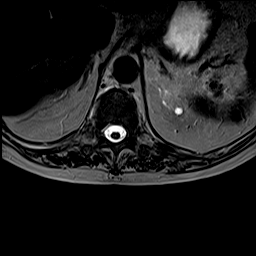
[im 10/40]
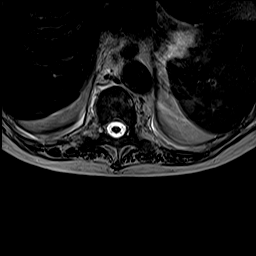
[im 13/40]
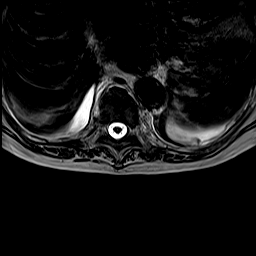
[im 16/40]
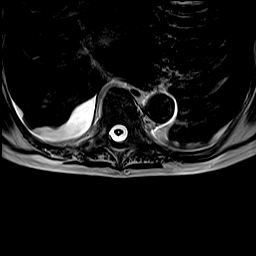
[im 19/40]
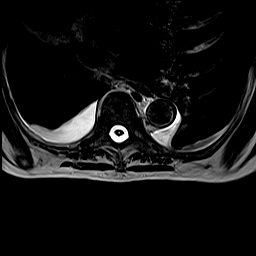
[im 22/40]
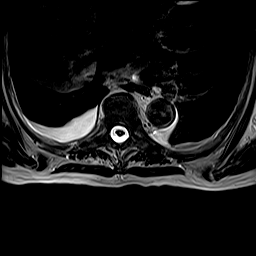
[im 25/40]
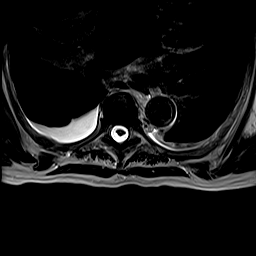
[im 28/40]
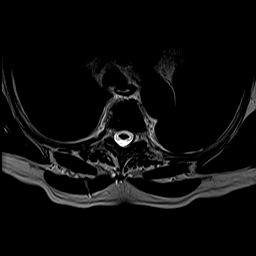
[im 31/40]
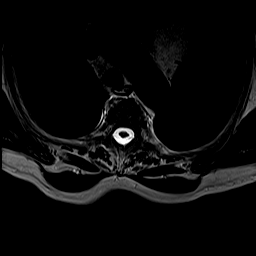
[im 34/40]
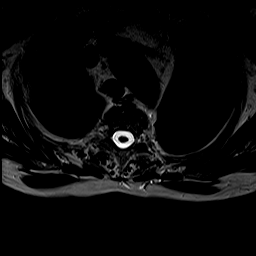
[im 37/40]
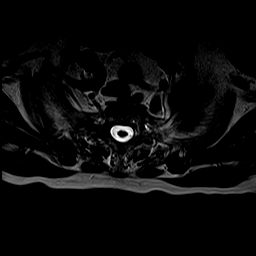
[im 40/40]
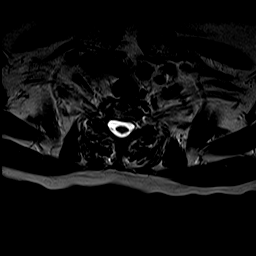

[Series 8: mpgr ax (3 · axial · 5.0mm · 0.78mm/px · z∈[-214,-63]mm · 14 of 40 slices shown]
[im 1/40]
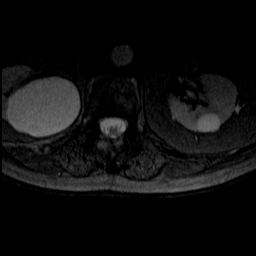
[im 4/40]
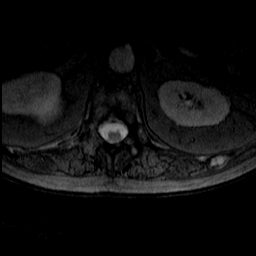
[im 7/40]
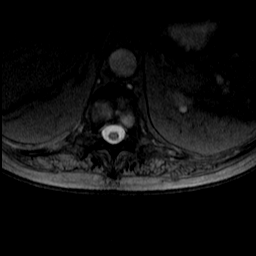
[im 10/40]
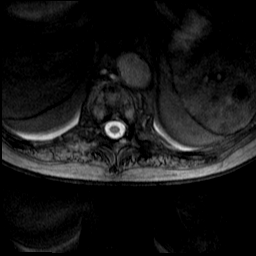
[im 13/40]
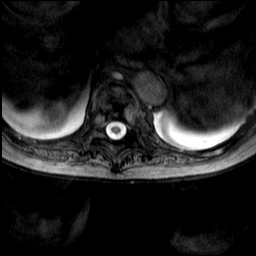
[im 16/40]
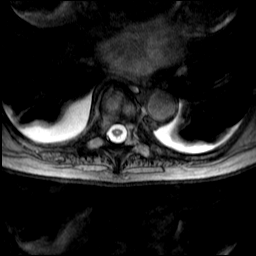
[im 19/40]
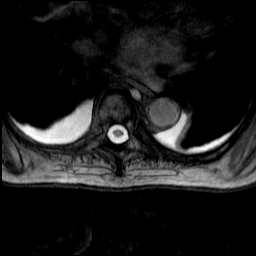
[im 22/40]
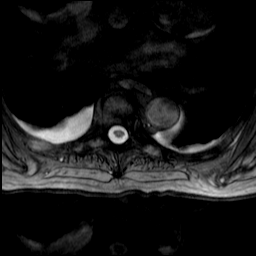
[im 25/40]
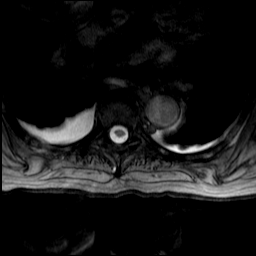
[im 28/40]
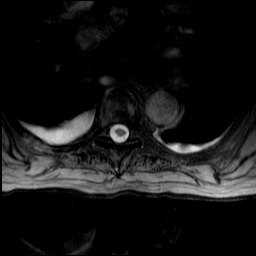
[im 31/40]
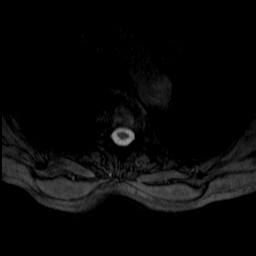
[im 34/40]
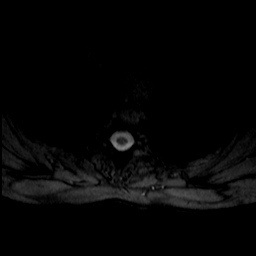
[im 37/40]
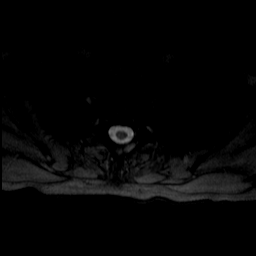
[im 40/40]
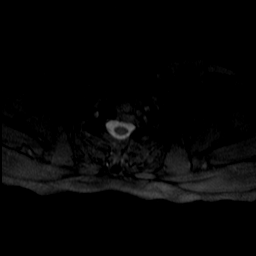

[48 of 48 positions shown; findings below may reference images not displayed]

FINDINGS: Limited cervical spine imaging: Preserved cervical vertebral height
and alignment, but scattered heterogeneous loss of the normal bone
marrow signal suggesting malignant marrow infiltration - such as at
the C2 spinous process, C7 vertebral body and spinous process
(series 2, image 5).

Thoracic spine segmentation:  Appears to be normal.

Alignment:  Exaggerated upper thoracic kyphosis.

Vertebrae: Widespread abnormal marrow signal throughout the thoracic
spine, and visible upper lumbar spine. Likewise there is
heterogeneous abnormal marrow signal in the visible posterior ribs.
Intermittent expansile rib lesions (such as the posterior left 11th
rib on series 8, image 37.

Widespread vertebral body and posterior element involvement (series
8, image 18), and the infiltrative appearing marrow signal is
confluent from the T3 through the T5 vertebrae.

Mild T2 superior endplate compression fracture which could be
pathologic, but probably was present on the 4027 CT.

There is a mild T10 superior endplate deformity which does
demonstrate mildly increased marrow edema compared to other levels
on series 6, image 7.

Despite the extensive spinal and skeletal involvement, there is only
minimal epidural extension of disease suspected in the thoracic
spine (such as at T4 on series 7, image 13). At the T12 level on the
right there is early encroachment into the right T11 neural foramen
due to extra osseous extension from the right T12 superior
articulating facet (series 5, image 4).

Cord: Capacious thoracic spinal canal. No thoracic spinal cord edema
or expansion. There is either mild physiologic prominence of the
central spinal canal at the T7-T8 level (series 7, image 23), or a
small spinal cord syrinx. The conus medullaris appears normal at
T12-L1.

Paraspinal and other soft tissues: Small bilateral pleural
effusions. Partially visible cardiomegaly. Grossly negative visible
liver. Benign appearing bilateral renal cysts. No lymphadenopathy in
the visible chest or upper abdomen. Negative visualized posterior
paraspinal soft tissues.

Disc levels:

Thoracic disc degeneration, but no thoracic spinal stenosis.

No thoracic neural foraminal stenosis aside from the early right
foraminal involvement at T11-T12 described above.
IMPRESSION: 1. Diffuse infiltration of the visible spine and skeleton in keeping
with widespread metastatic disease, multiple myeloma, or less likely
lymphoma. Occasional mild pathologic compression fractures,
including the T10 level.
2. Minimal extra-osseous and epidural extension of disease in the
thoracic spine despite the extensive spinal involvement. No thoracic
spinal stenosis. Early involvement of the right T11 neural foramen.
3. Small bilateral pleural effusions. Cardiomegaly. The visible
liver appears to remain normal.
